# Patient Record
Sex: Male | Born: 1995 | Hispanic: Yes | Marital: Single | Smoking: Never smoker
Health system: Southern US, Community
[De-identification: ages and names within clinical notes are randomized; demographics above are authoritative.]

## PROBLEM LIST (undated history)

## (undated) DIAGNOSIS — F909 Attention-deficit hyperactivity disorder, unspecified type: Secondary | ICD-10-CM

---

## 2018-07-17 ENCOUNTER — Ambulatory Visit (HOSPITAL_COMMUNITY)
Admission: EM | Admit: 2018-07-17 | Discharge: 2018-07-17 | Disposition: A | Discharge status: Home / Self Care | Payer: Managed Care, Other (non HMO) | Attending: Internal Medicine | Admitting: Internal Medicine

## 2018-07-17 ENCOUNTER — Other Ambulatory Visit: Payer: Self-pay

## 2018-07-17 ENCOUNTER — Encounter (HOSPITAL_COMMUNITY): Payer: Self-pay | Admitting: Emergency Medicine

## 2018-07-17 DIAGNOSIS — R69 Illness, unspecified: Principal | ICD-10-CM

## 2018-07-17 DIAGNOSIS — J111 Influenza due to unidentified influenza virus with other respiratory manifestations: Secondary | ICD-10-CM

## 2018-07-17 DIAGNOSIS — R1110 Vomiting, unspecified: Secondary | ICD-10-CM

## 2018-07-17 DIAGNOSIS — J9801 Acute bronchospasm: Secondary | ICD-10-CM

## 2018-07-17 DIAGNOSIS — R05 Cough: Secondary | ICD-10-CM

## 2018-07-17 HISTORY — DX: Attention-deficit hyperactivity disorder, unspecified type: F90.9

## 2018-07-17 MED ORDER — ALBUTEROL SULFATE HFA 108 (90 BASE) MCG/ACT IN AERS: 1.0000 | INHALATION_SPRAY | RESPIRATORY_TRACT | 0 refills | Status: AC | PRN

## 2018-07-17 MED ORDER — IPRATROPIUM-ALBUTEROL 0.5-2.5 (3) MG/3ML IN SOLN
3.0000 mL | Freq: Once | RESPIRATORY_TRACT | Status: AC
  Administered 2018-07-17: 3 mL via RESPIRATORY_TRACT

## 2018-07-17 MED ORDER — IPRATROPIUM-ALBUTEROL 0.5-2.5 (3) MG/3ML IN SOLN
RESPIRATORY_TRACT | Status: AC
  Filled 2018-07-17: qty 3

## 2018-07-17 MED ORDER — PREDNISONE 50 MG PO TABS: 50.0000 mg | ORAL_TABLET | Freq: Every day | ORAL | 0 refills | Status: AC

## 2018-07-17 MED ORDER — MUCINEX DM 30-600 MG PO TB12: 1.0000 | ORAL_TABLET | Freq: Two times a day (BID) | ORAL | 0 refills | Status: AC

## 2018-07-17 NOTE — Discharge Instructions (Signed)
Symptoms today seem most consistent with a flu like illness.  Breathing treatment was given at the urgent care and prescriptions for albuterol inhaler and prednisone were sent to the pharmacy.  Anticipate gradual improvement in cough over the next several days; may take a couple weeks for the cough to resolve.  Recheck for new fever >100.5, increasing phlegm production/nasal discharge, or if not starting to improve in a few days.

## 2018-07-17 NOTE — ED Triage Notes (Signed)
The patient presented to the [facility] with a complaint of a cough x 1 week. 

## 2018-07-17 NOTE — ED Provider Notes (Signed)
[facility]    CSN: [id number] Arrival date & time: 07/17/18  1709     History   Chief Complaint Chief Complaint  Patient presents with  . Cough    HPI Delwyn Lucibello is a 23 y.o. male.   He presents today with cough.  He had onset of fever and cough about 5 days ago, fever has improved, but last evening had some posttussive emesis.  Little bit of runny/congested nose, little bit of malaise.  No diarrhea.   HPI  Past Medical History:  Diagnosis Date  . ADHD     History reviewed. No pertinent surgical history.     Home Medications    Prior to Admission medications   Medication Sig Start Date End Date Taking? Authorizing Provider  lisdexamfetamine (VYVANSE) 20 MG capsule Take 20 mg by mouth daily.   Yes [provider]  albuterol (PROVENTIL HFA;VENTOLIN HFA) 108 (90 Base) MCG/ACT inhaler Inhale 1-2 puffs into the lungs every 4 (four) hours as needed for wheezing or shortness of breath. 07/17/18   Isa Rankin, MD  Dextromethorphan-guaiFENesin [facility] DM) 30-600 MG TB12 Take 1 tablet by mouth 2 (two) times daily for 14 days. 07/17/18 07/31/18  Isa Rankin, MD  predniSONE (DELTASONE) 50 MG tablet Take 1 tablet (50 mg total) by mouth daily. 07/17/18   Isa Rankin, MD    Family History History reviewed. No pertinent family history.  Social History Social History   Tobacco Use  . Smoking status: Never Smoker  . Smokeless tobacco: Never Used  Substance Use Topics  . Alcohol use: Yes    Comment: Occ  . Drug use: Yes    Types: Marijuana     Allergies   Patient has no known allergies.   Review of Systems Review of Systems  All other systems reviewed and are negative.    Physical Exam Triage Vital Signs ED Triage Vitals  Enc Vitals Group     BP 07/17/18 1756 139/80     Pulse Rate 07/17/18 1756 65     Resp 07/17/18 1756 18     Temp 07/17/18 1756 98.2 F (36.8 C)     Temp Source 07/17/18 1756 Oral     SpO2 07/17/18  1756 99 %     Weight --      Height --      Pain Score 07/17/18 1755 0     Pain Loc --    Updated Vital Signs BP 139/80 (BP Location: Right Arm)   Pulse 65   Temp 98.2 F (36.8 C) (Oral)   Resp 18   SpO2 99%  Physical Exam Vitals signs and nursing note reviewed.  Constitutional:      Comments: Alert, nicely groomed Looks ill but not toxic  HENT:     Head: Atraumatic.     Comments: Bilateral TMs are translucent, pink flushed Moderate nasal congestion bilaterally with mucopurulent material present Posterior pharynx moderately injected  Eyes:     Comments: Conjugate gaze, no eye redness/drainage  Neck:     Musculoskeletal: Neck supple.  Cardiovascular:     Rate and Rhythm: Normal rate and regular rhythm.  Pulmonary:     Effort: No respiratory distress.     Breath sounds: No wheezing or rales.     Comments: Coarse but symmetric breath sounds throughout, diminished posteriorly Abdominal:     General: There is no distension.  Musculoskeletal: Normal range of motion.  Skin:    General: Skin is warm and  dry.     Comments: No cyanosis  Neurological:     Mental Status: He is alert and oriented to person, place, and time.      UC Treatments / Results   Procedures Procedures (including critical care time)  Medications Ordered in UC Medications  ipratropium-albuterol (DUONEB) 0.5-2.5 (3) MG/3ML nebulizer solution 3 mL (3 mLs Nebulization Given 07/17/18 1844)   Patient reports significant improvement in cough/chest tightness after breathing treatment.    Final Clinical Impressions(s) / UC Diagnoses   Final diagnoses:  Influenza-like illness  Post-tussive emesis  Bronchospasm     Discharge Instructions     Symptoms today seem most consistent with a flu like illness.  Breathing treatment was given at the urgent care and prescriptions for albuterol inhaler and prednisone were sent to the pharmacy.  Anticipate gradual improvement in cough over the next several days;  may take a couple weeks for the cough to resolve.  Recheck for new fever >100.5, increasing phlegm production/nasal discharge, or if not starting to improve in a few days.       ED Prescriptions    Medication Sig Dispense Auth. Provider   Dextromethorphan-guaiFENesin [facility] DM) 30-600 MG TB12 Take 1 tablet by mouth 2 (two) times daily for 14 days. 28 each Isa Rankin, MD   predniSONE (DELTASONE) 50 MG tablet Take 1 tablet (50 mg total) by mouth daily. 3 tablet Isa Rankin, MD   albuterol (PROVENTIL HFA;VENTOLIN HFA) 108 (90 Base) MCG/ACT inhaler Inhale 1-2 puffs into the lungs every 4 (four) hours as needed for wheezing or shortness of breath. 1 Inhaler Isa Rankin, MD        Isa Rankin, MD 07/18/18 1017

## 2019-03-17 ENCOUNTER — Emergency Department (HOSPITAL_COMMUNITY)
Admission: EM | Admit: 2019-03-17 | Discharge: 2019-03-18 | Disposition: A | Discharge status: Home / Self Care | Payer: Commercial Managed Care - PPO | Attending: Emergency Medicine | Admitting: Emergency Medicine

## 2019-03-17 ENCOUNTER — Encounter (HOSPITAL_COMMUNITY): Payer: Self-pay | Admitting: Emergency Medicine

## 2019-03-17 ENCOUNTER — Emergency Department (HOSPITAL_COMMUNITY): Payer: Commercial Managed Care - PPO

## 2019-03-17 ENCOUNTER — Other Ambulatory Visit: Payer: Self-pay

## 2019-03-17 DIAGNOSIS — X509XXA Other and unspecified overexertion or strenuous movements or postures, initial encounter: Secondary | ICD-10-CM | POA: Insufficient documentation

## 2019-03-17 DIAGNOSIS — Z79899 Other long term (current) drug therapy: Secondary | ICD-10-CM | POA: Insufficient documentation

## 2019-03-17 DIAGNOSIS — S93401A Sprain of unspecified ligament of right ankle, initial encounter: Secondary | ICD-10-CM | POA: Diagnosis not present

## 2019-03-17 DIAGNOSIS — M25561 Pain in right knee: Secondary | ICD-10-CM | POA: Insufficient documentation

## 2019-03-17 DIAGNOSIS — Y999 Unspecified external cause status: Secondary | ICD-10-CM | POA: Diagnosis not present

## 2019-03-17 DIAGNOSIS — Y9366 Activity, soccer: Secondary | ICD-10-CM | POA: Diagnosis not present

## 2019-03-17 DIAGNOSIS — Y92322 Soccer field as the place of occurrence of the external cause: Secondary | ICD-10-CM | POA: Insufficient documentation

## 2019-03-17 DIAGNOSIS — S99911A Unspecified injury of right ankle, initial encounter: Secondary | ICD-10-CM | POA: Diagnosis present

## 2019-03-17 NOTE — Discharge Instructions (Addendum)
Please read the attachments.  Take ibuprofen as prescribed.  Please follow-up with sports medicine for further evaluation and management.

## 2019-03-17 NOTE — ED Triage Notes (Signed)
Patient reports falling and injuring right knee and ankle while playing soccer today. States leg is aching. Ambulatory.

## 2019-03-17 NOTE — ED Provider Notes (Signed)
[facility] DEPT Provider Note   CSN: [id number] Arrival date & time: 03/17/19  2117     History   Chief Complaint Chief Complaint  Patient presents with  . Knee Pain  . Ankle Pain    HPI Kyle Lutz is a 23 y.o. male with no relevant past medical history presents to the ED after rolling his ankle playing soccer.  Patient reports he was going for a headache and when he came down he landed on an inverted right foot.  He then proceeded to awkwardly crumple to the ground which also elicited right knee pain.  He was able to ambulate but with discomfort.  He also maintains that he was able to drive him to the ER, without difficulty.  His symptoms are purely from mechanical trauma and denies any recent illness, fevers, chills, or any other symptoms or sexual history that might also precipitate joint pain.      HPI  Past Medical History:  Diagnosis Date  . ADHD     There are no active problems to display for this patient.   History reviewed. No pertinent surgical history.      Home Medications    Prior to Admission medications   Medication Sig Start Date End Date Taking? Authorizing Provider  lisdexamfetamine (VYVANSE) 20 MG capsule Take 20 mg by mouth daily.   Yes [provider]  albuterol (PROVENTIL HFA;VENTOLIN HFA) 108 (90 Base) MCG/ACT inhaler Inhale 1-2 puffs into the lungs every 4 (four) hours as needed for wheezing or shortness of breath. Patient not taking: Reported on 03/17/2019 07/17/18   Wynona Luna, MD  ibuprofen (ADVIL) 800 MG tablet Take 1 tablet (800 mg total) by mouth 3 (three) times daily. 03/18/19   Corena Herter, PA-C  predniSONE (DELTASONE) 50 MG tablet Take 1 tablet (50 mg total) by mouth daily. Patient not taking: Reported on 03/17/2019 07/17/18   Wynona Luna, MD    Family History No family history on file.  Social History Social History   Tobacco Use  . Smoking status: Never Smoker  .  Smokeless tobacco: Never Used  Substance Use Topics  . Alcohol use: Yes    Comment: Occ  . Drug use: Yes    Types: Marijuana     Allergies   Patient has no known allergies.   Review of Systems Review of Systems  All other systems reviewed and are negative.    Physical Exam Updated Vital Signs BP 123/78 (BP Location: Right Arm)   Pulse 87   Temp 98 F (36.7 C) (Oral)   Resp 18   SpO2 99%   Physical Exam Vitals signs and nursing note reviewed. Exam conducted with a chaperone present.  Constitutional:      Appearance: Normal appearance.  HENT:     Head: Normocephalic and atraumatic.  Eyes:     General: No scleral icterus.    Conjunctiva/sclera: Conjunctivae normal.  Pulmonary:     Effort: Pulmonary effort is normal.  Musculoskeletal:     Comments: Right knee: No significant TTP, but discomfort with flexion and extension.  No overlying erythema or discoloration.  Right ankle: Swelling noted immediately proximal to the ankle, with TTP of distal tibia, but no TTP of medial of lateral malleolus.  No calcaneal TTP.  No obvious discoloration or ecchymosis.  Distal pulses and sensation intact.  Patient can flex and extend at the ankle without significant discomfort.  Right foot: Patient has evidence of bruising in various stages  of healing status, but he attributes those injuries to being stepped on with soccer cleats.  No metatarsal or phalange TTP.  Skin:    General: Skin is dry.  Neurological:     Mental Status: He is alert.     GCS: GCS eye subscore is 4. GCS verbal subscore is 5. GCS motor subscore is 6.  Psychiatric:        Mood and Affect: Mood normal.        Behavior: Behavior normal.        Thought Content: Thought content normal.      ED Treatments / Results  Labs (all labs ordered are listed, but only abnormal results are displayed) Labs Reviewed - No data to display  EKG None  Radiology Dg Ankle Complete Right  Result Date: 03/17/2019 CLINICAL  DATA:  Soccer injury EXAM: RIGHT ANKLE - COMPLETE 3+ VIEW COMPARISON:  None. FINDINGS: There is no evidence of fracture, dislocation, or joint effusion. There is no evidence of arthropathy or other focal bone abnormality. Soft tissues are unremarkable. IMPRESSION: Negative. Electronically Signed   By: Jasmine Pang M.D.   On: 03/17/2019 22:46   Dg Knee Complete 4 Views Right  Result Date: 03/17/2019 CLINICAL DATA:  Soccer injury EXAM: RIGHT KNEE - COMPLETE 4+ VIEW COMPARISON:  None. FINDINGS: No evidence of fracture, dislocation, or joint effusion. No evidence of arthropathy or other focal bone abnormality. Soft tissues are unremarkable. IMPRESSION: Negative. Electronically Signed   By: Jasmine Pang M.D.   On: 03/17/2019 22:46    Procedures Procedures (including critical care time)  Medications Ordered in ED Medications - No data to display   Initial Impression / Assessment and Plan / ED Course  I have reviewed the triage vital signs and the nursing notes.  Pertinent labs & imaging results that were available during my care of the patient were reviewed by me and considered in my medical decision making (see chart for details).       Patient had complete plain films obtained of his right ankle and right knee which showed no evidence of fracture, dislocation, effusion or any other bony abnormalities.  Patient's history, physical exam, and radiologic findings are consistent with an ankle sprain of undetermined severity.  Applied ASO ankle and provided patient with crutches.  Instructed him to be nonweightbearing until further evaluation by an orthopedist.  Provided work note requesting light duty assignment.  Please apply ice to the affected area and take ibuprofen 800 mg 3 times daily as needed for pain discomfort.  Provided instructions for RICE therapy.  All of the evaluation and work-up results were discussed with the patient. They were provided opportunity to ask any additional questions  and have none at this time. They have expressed understanding of verbal discharge instructions as well as return precautions and are agreeable to the plan.    Final Clinical Impressions(s) / ED Diagnoses   Final diagnoses:  Sprain of right ankle, unspecified ligament, initial encounter    ED Discharge Orders         Ordered    ibuprofen (ADVIL) 800 MG tablet  3 times daily     03/18/19 0016           Lorelee New, PA-C 03/18/19 0017    Melene Plan, DO 03/20/19 [phone]

## 2019-03-18 DIAGNOSIS — S93401A Sprain of unspecified ligament of right ankle, initial encounter: Secondary | ICD-10-CM | POA: Diagnosis not present

## 2019-03-18 MED ORDER — IBUPROFEN 800 MG PO TABS: 800.0000 mg | ORAL_TABLET | Freq: Three times a day (TID) | ORAL | 0 refills | Status: AC

## 2020-11-08 ENCOUNTER — Emergency Department (HOSPITAL_COMMUNITY): Payer: Managed Care, Other (non HMO)

## 2020-11-08 ENCOUNTER — Encounter (HOSPITAL_COMMUNITY): Payer: Self-pay

## 2020-11-08 ENCOUNTER — Emergency Department (HOSPITAL_COMMUNITY)
Admission: EM | Admit: 2020-11-08 | Discharge: 2020-11-08 | Disposition: A | Discharge status: Home / Self Care | Payer: Managed Care, Other (non HMO) | Attending: Emergency Medicine | Admitting: Emergency Medicine

## 2020-11-08 DIAGNOSIS — S52612A Displaced fracture of left ulna styloid process, initial encounter for closed fracture: Secondary | ICD-10-CM | POA: Diagnosis not present

## 2020-11-08 DIAGNOSIS — S52502A Unspecified fracture of the lower end of left radius, initial encounter for closed fracture: Secondary | ICD-10-CM | POA: Diagnosis not present

## 2020-11-08 DIAGNOSIS — S6992XA Unspecified injury of left wrist, hand and finger(s), initial encounter: Secondary | ICD-10-CM | POA: Diagnosis present

## 2020-11-08 MED ORDER — NAPROXEN 500 MG PO TABS
500.0000 mg | ORAL_TABLET | Freq: Once | ORAL | Status: AC
  Administered 2020-11-08: 500 mg via ORAL
  Filled 2020-11-08: qty 1

## 2020-11-08 MED ORDER — TRAMADOL HCL 50 MG PO TABS: 50.0000 mg | ORAL_TABLET | Freq: Four times a day (QID) | ORAL | 0 refills | Status: AC | PRN

## 2020-11-08 NOTE — ED Provider Notes (Signed)
[facility]-EMERGENCY DEPT Provider Note   CSN: [id number] Arrival date & time: 11/08/20  0344     History Chief Complaint  Patient presents with  . Wrist Injury    Kyle Lutz is a 25 y.o. male.  Pt complains of L wrist pain after falling off skateboard at 2200. No medications PTA. Pain is constant and worse with movement. Patient is right hand dominant  The history is provided by the patient. No language interpreter was used.  Wrist Injury Location:  Wrist Wrist location:  L wrist Injury: yes   Time since incident:  6 hours Mechanism of injury: fall   Fall:    Fall occurred: from a skateboard.   Point of impact:  Outstretched arms   Entrapped after fall: no   Pain details:    Radiates to:  Does not radiate   Severity:  Moderate   Onset quality:  Sudden   Duration:  6 hours   Timing:  Constant   Progression:  Worsening Handedness:  Right-handed Prior injury to area:  No Relieved by:  Nothing Worsened by:  Movement Ineffective treatments:  None tried Associated symptoms: decreased range of motion   Associated symptoms: no numbness   Risk factors: no known bone disorder and no frequent fractures        Past Medical History:  Diagnosis Date  . ADHD     There are no problems to display for this patient.   History reviewed. No pertinent surgical history.     History reviewed. No pertinent family history.  Social History   Tobacco Use  . Smoking status: Never Smoker  . Smokeless tobacco: Never Used  Vaping Use  . Vaping Use: Never used  Substance Use Topics  . Alcohol use: Yes    Comment: Occ  . Drug use: Yes    Types: Marijuana    Home Medications Prior to Admission medications   Medication Sig Start Date End Date Taking? Authorizing Provider  traMADol (ULTRAM) 50 MG tablet Take 1 tablet (50 mg total) by mouth every 6 (six) hours as needed for moderate pain or severe pain. 11/08/20  Yes Antony Madura, PA-C  albuterol  (PROVENTIL HFA;VENTOLIN HFA) 108 (90 Base) MCG/ACT inhaler Inhale 1-2 puffs into the lungs every 4 (four) hours as needed for wheezing or shortness of breath. Patient not taking: Reported on 03/17/2019 07/17/18   Isa Rankin, MD  ibuprofen (ADVIL) 800 MG tablet Take 1 tablet (800 mg total) by mouth 3 (three) times daily. 03/18/19   Lorelee New, PA-C  lisdexamfetamine (VYVANSE) 20 MG capsule Take 20 mg by mouth daily.    [provider]  predniSONE (DELTASONE) 50 MG tablet Take 1 tablet (50 mg total) by mouth daily. Patient not taking: Reported on 03/17/2019 07/17/18   Isa Rankin, MD    Allergies    Patient has no known allergies.  Review of Systems   Review of Systems Ten systems reviewed and are negative for acute change, except as noted in the HPI.    Physical Exam Updated Vital Signs BP (!) 134/92   Pulse 74   Temp 98.4 F (36.9 C) (Oral)   Resp 20   Ht 5\' 10"  (1.778 m)   Wt 83.9 kg   SpO2 98%   BMI 26.54 kg/m   Physical Exam Vitals and nursing note reviewed.  Constitutional:      General: He is not in acute distress.    Appearance: He is well-developed. He is  not diaphoretic.  HENT:     Head: Normocephalic and atraumatic.  Eyes:     General: No scleral icterus.    Conjunctiva/sclera: Conjunctivae normal.  Cardiovascular:     Rate and Rhythm: Normal rate and regular rhythm.     Pulses: Normal pulses.     Comments: Distal radial pulse 2+ in the left upper extremity Pulmonary:     Effort: Pulmonary effort is normal. No respiratory distress.  Musculoskeletal:        General: Swelling, tenderness, deformity and signs of injury present.     Left wrist: Swelling, deformity (distal radius) and bony tenderness present. No lacerations. Decreased range of motion. Normal pulse.     Cervical back: Normal range of motion.  Skin:    General: Skin is warm and dry.     Coloration: Skin is not pale.     Findings: No erythema or rash.  Neurological:      Mental Status: He is alert and oriented to person, place, and time.     Coordination: Coordination normal.     Comments: Sensation to light touch intact.  Patient able to wiggle all fingers.  Psychiatric:        Behavior: Behavior normal.     ED Results / Procedures / Treatments   Labs (all labs ordered are listed, but only abnormal results are displayed) Labs Reviewed - No data to display  EKG None  Radiology DG Wrist Complete Left  Result Date: 11/08/2020 CLINICAL DATA:  Fall. EXAM: LEFT WRIST - COMPLETE 3+ VIEW COMPARISON:  No prior. FINDINGS: Minimally angulated overriding distal left radial metaphyseal fracture. Slightly displaced ulnar styloid fracture. Diffuse soft tissue swelling. IMPRESSION: 1. Minimally angulated overriding distal left radial metaphyseal fracture. 2.  Slightly displaced ulnar styloid fracture. Electronically Signed   By: Maisie Fus  Register   On: 11/08/2020 05:43    Procedures Procedures   Medications Ordered in ED Medications  naproxen (NAPROSYN) tablet 500 mg (500 mg Oral Given 11/08/20 0414)    ED Course  I have reviewed the triage vital signs and the nursing notes.  Pertinent labs & imaging results that were available during my care of the patient were reviewed by me and considered in my medical decision making (see chart for details).  Clinical Course as of 11/08/20 9381  Wed Nov 08, 2020  0175 X-ray reviewed by me.  There appears to be a distal radius fracture as well as a fracture of the ulnar styloid.  Order placed for short arm splint placement. [KH]    Clinical Course User Index [KH] Antony Madura, PA-C   MDM Rules/Calculators/A&P                          25 year old right-hand-dominant male presenting for left wrist pain after a fall from his skateboard.  Found to have a minimally angulated overriding distal left radial metaphyseal fracture.  Also fracture of the ulnar styloid.  Placed in sugar-tong splint and given referral to hand surgery  for follow-up to ensure proper healing.  Return precautions discussed and provided. Patient discharged in stable condition with no unaddressed concerns.   Final Clinical Impression(s) / ED Diagnoses Final diagnoses:  Closed fracture of distal end of left radius, unspecified fracture morphology, initial encounter  Closed displaced fracture of styloid process of left ulna, initial encounter    Rx / DC Orders ED Discharge Orders         Ordered    traMADol Janean Sark)  50 MG tablet  Every 6 hours PRN        11/08/20 0619           Antony Madura, PA-C 11/08/20 0131    Geoffery Lyons, MD 11/09/20 [phone]

## 2020-11-08 NOTE — ED Provider Notes (Signed)
Emergency Medicine Provider Triage Evaluation Note  Kyle Lutz , a 25 y.o. male  was evaluated in triage.  Pt complains of L wrist pain after falling off skateboard at 2200. No medications PTA. Pain is constant and worse with movement. Patient is right hand dominant  Review of Systems  Positive: Wrist pain Negative: Numbness  Physical Exam  BP (!) 152/90 (BP Location: Right Arm)   Pulse 78   Temp 98.4 F (36.9 C) (Oral)   Resp 16   Ht 5\' 10"  (1.778 m)   Wt 83.9 kg   SpO2 99%   BMI 26.54 kg/m  Gen:   Awake, no distress   Resp:  Normal effort  MSK:   Decreased ROM of L wrist   Medical Decision Making  Medically screening exam initiated at 4:06 AM.  Appropriate orders placed.  Kyle Lutz was informed that the remainder of the evaluation will be completed by another provider, this initial triage assessment does not replace that evaluation, and the importance of remaining in the ED until their evaluation is complete.  L wrist fracture   Markham Jordan, PA-C 11/08/20 0408    01/08/21, MD 11/09/20 [phone]

## 2020-11-08 NOTE — ED Notes (Signed)
Ortho tech called - states he is on the way

## 2020-11-08 NOTE — ED Triage Notes (Signed)
Pt arrived via POV, c/o left wrist pain after falling off skateboard last night. Denies any other injury to body.

## 2020-11-08 NOTE — ED Notes (Signed)
Attempted to call ortho tech - no answer

## 2020-11-08 NOTE — Progress Notes (Signed)
Orthopedic Tech Progress Note Patient Details:  Kyle Lutz 01-24-1996 [id number]  Ortho Devices Type of Ortho Device: Sugartong splint,Arm sling Ortho Device/Splint Location: Left Arm Ortho Device/Splint Interventions: Application   Post Interventions Patient Tolerated: Well Instructions Provided: Care of device   Kyle Lutz E Jahir Halt 11/08/2020, 9:25 AM

## 2020-11-08 NOTE — Discharge Instructions (Signed)
You are found to have a fracture to your radius as well as your ulnar styloid.  You were placed in a splint.  Keep this on at all times and do not remove it.  In short stays dry while bathing.  You may use tramadol as prescribed for management of pain.  Do not drive or drink alcohol after taking this medication as it may make you drowsy and impair your judgment.  Call the office of Dr. Janee Morn to coordinate follow-up and ensure proper healing of your broken bones.  You may return to the ED for new or concerning symptoms.

## 2020-11-08 NOTE — ED Notes (Signed)
Called [place] ortho tech left voicemail

## 2021-12-29 IMAGING — CR DG WRIST COMPLETE 3+V*L*
4 series · 4 of 4 positions shown · non-contrast
Comparison: No prior.

CLINICAL DATA: Fall.

EXAM:
LEFT WRIST - COMPLETE 3+ VIEW

[x wrist pa left]
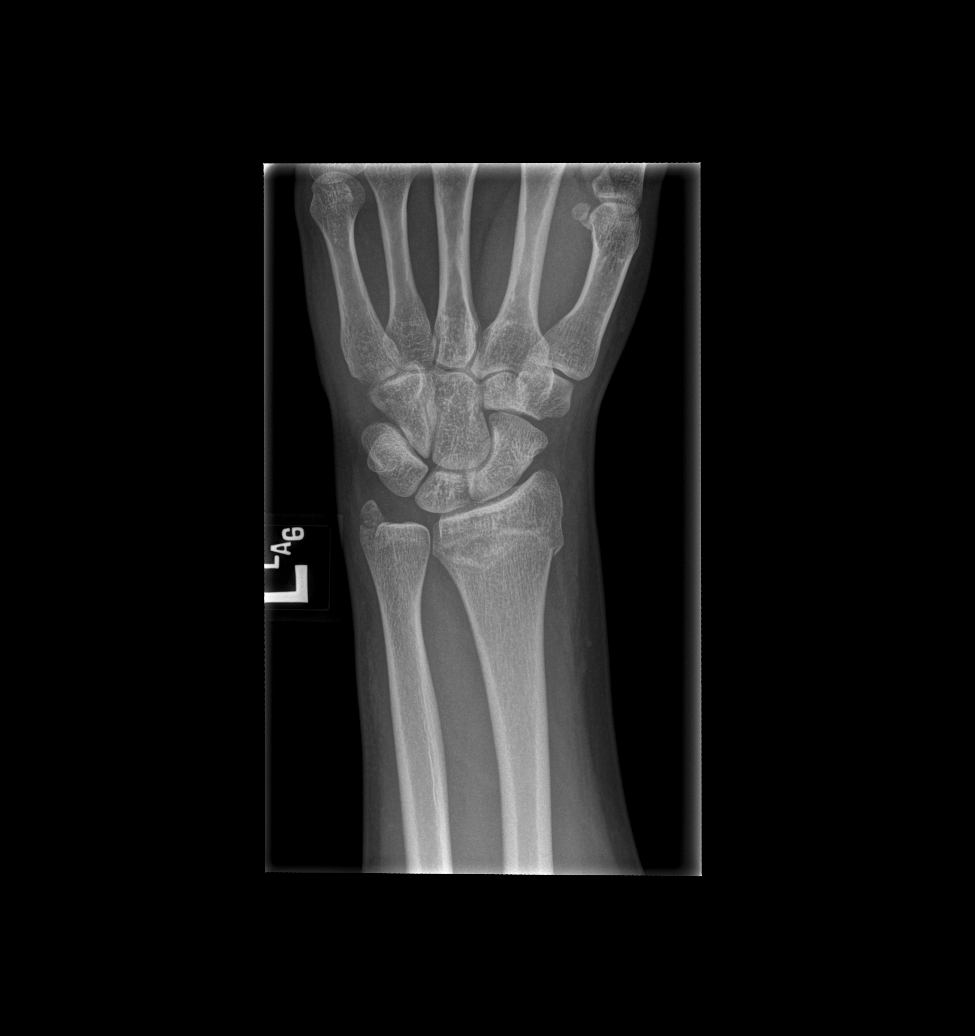

[x wrist obl left]
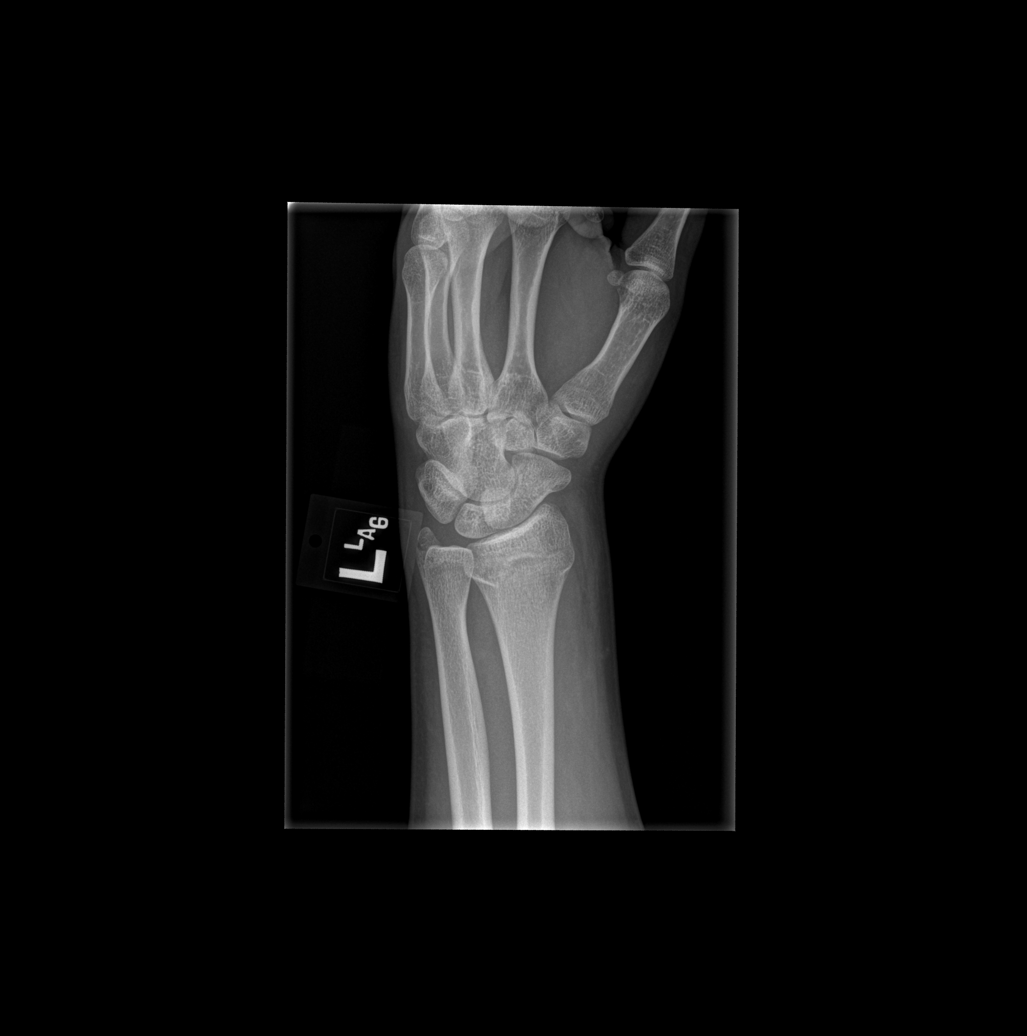

[x wrist lat left]
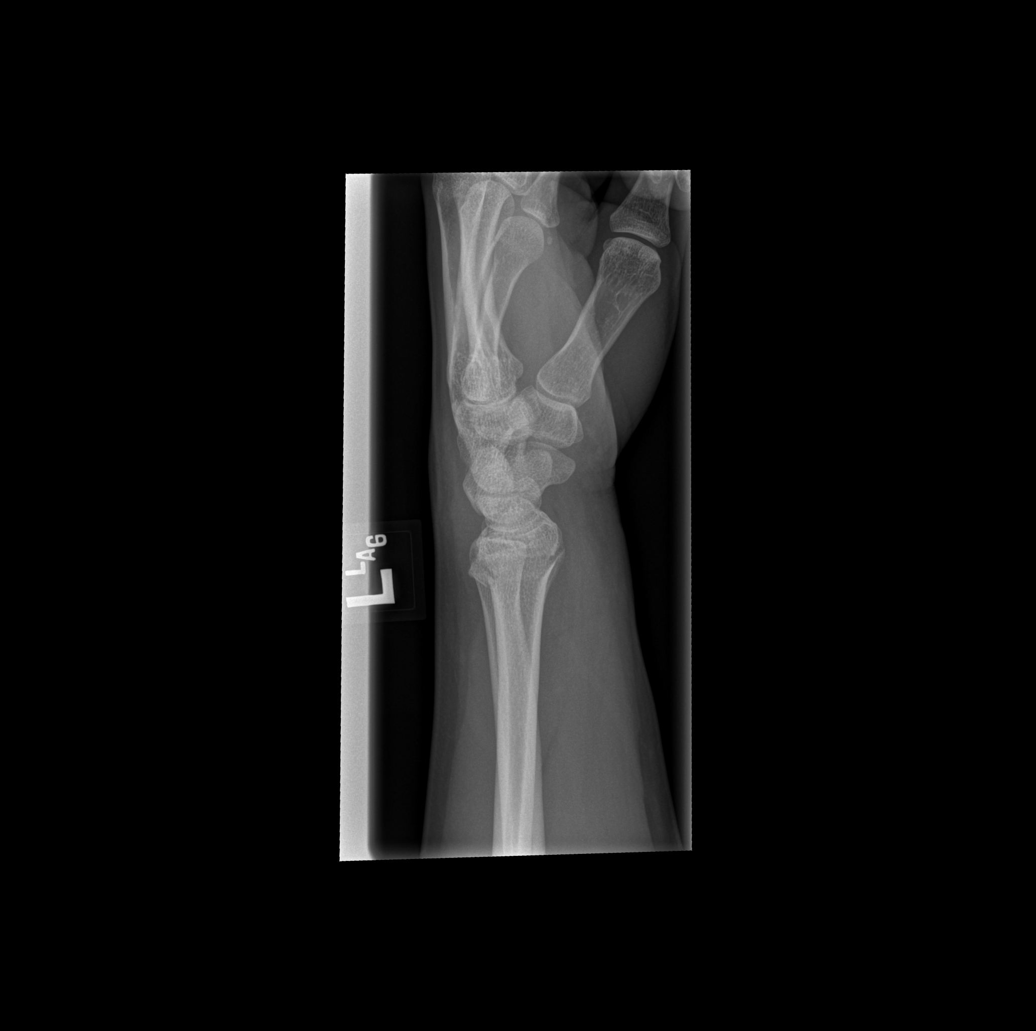

[x wrist navicular view left]
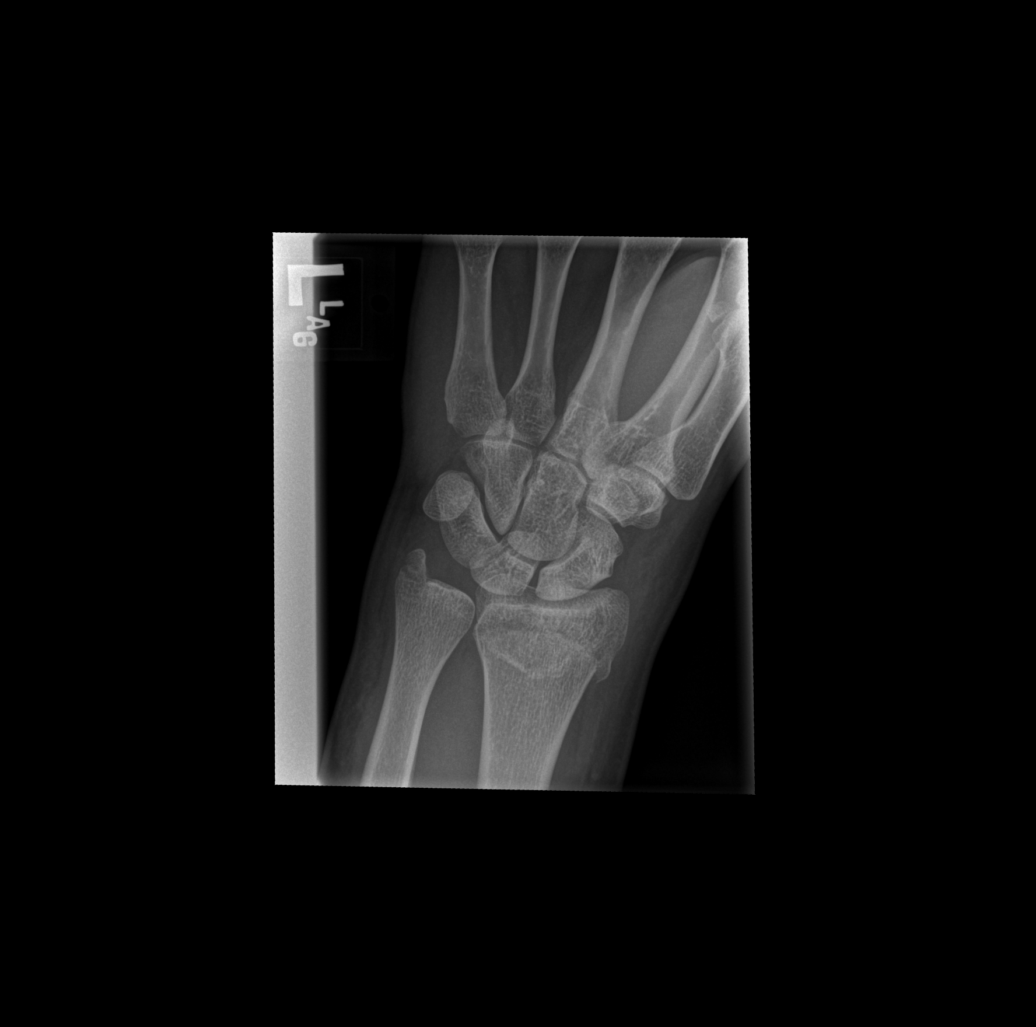

[4 of 4 positions shown; findings below may reference images not displayed]

FINDINGS: Minimally angulated overriding distal left radial metaphyseal
fracture. Slightly displaced ulnar styloid fracture. Diffuse soft
tissue swelling.
IMPRESSION: 1. Minimally angulated overriding distal left radial metaphyseal
fracture.

2.  Slightly displaced ulnar styloid fracture.
